# Patient Record
Sex: Male | Born: 1985 | Race: Black or African American | Hispanic: No | Marital: Married | Smoking: Never smoker
Health system: Southern US, Community
[De-identification: ages and names within clinical notes are randomized; demographics above are authoritative.]

## PROBLEM LIST (undated history)

## (undated) ENCOUNTER — Emergency Department: Admission: EM | Payer: Non-veteran care | Source: Home / Self Care

## (undated) DIAGNOSIS — Z789 Other specified health status: Secondary | ICD-10-CM

## (undated) HISTORY — PX: SHOULDER ARTHROSCOPY: SHX128

---
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
## 2015-05-28 ENCOUNTER — Emergency Department (HOSPITAL_COMMUNITY)
Admission: EM | Admit: 2015-05-28 | Discharge: 2015-05-28 | Disposition: A | Discharge status: Home / Self Care | Payer: Self-pay | Attending: Emergency Medicine | Admitting: Emergency Medicine

## 2015-05-28 ENCOUNTER — Emergency Department (HOSPITAL_COMMUNITY): Payer: Self-pay

## 2015-05-28 ENCOUNTER — Encounter (HOSPITAL_COMMUNITY): Payer: Self-pay | Admitting: *Deleted

## 2015-05-28 DIAGNOSIS — M25562 Pain in left knee: Secondary | ICD-10-CM | POA: Insufficient documentation

## 2015-05-28 DIAGNOSIS — G8929 Other chronic pain: Secondary | ICD-10-CM | POA: Insufficient documentation

## 2015-05-28 MED ORDER — KETOROLAC TROMETHAMINE 60 MG/2ML IM SOLN
60.0000 mg | Freq: Once | INTRAMUSCULAR | Status: AC
  Administered 2015-05-28: 60 mg via INTRAMUSCULAR
  Filled 2015-05-28: qty 2

## 2015-05-28 MED ORDER — DICLOFENAC SODIUM 1 % TD GEL: 4.0000 g | Freq: Four times a day (QID) | TRANSDERMAL | Status: DC

## 2015-05-28 MED ORDER — IBUPROFEN 800 MG PO TABS: 800.0000 mg | ORAL_TABLET | Freq: Three times a day (TID) | ORAL | Status: DC

## 2015-05-28 MED ORDER — OXYCODONE-ACETAMINOPHEN 5-325 MG PO TABS: 1.0000 | ORAL_TABLET | ORAL | Status: DC | PRN

## 2015-05-28 NOTE — ED Provider Notes (Signed)
CSN: [id number]     Arrival date & time 05/28/15  1023 History  By signing my name below, I, Linna Darner, attest that this documentation has been prepared under the direction and in the presence of non-physician practitioner, Harolyn Rutherford, PA-C. Electronically Signed: Linna Darner, Scribe. 05/28/2015. 10:33 AM.    Chief Complaint  Patient presents with  . Knee Pain    The history is provided by the patient. No language interpreter was used.     HPI Comments: Carlos Mcclure is a 30 y.o. male with no pertinent PMHx who presents to the Emergency Department complaining of gradual onset, constant, severe, worsening, stabbing, left knee pain and swelling for the last month. Pt reports that he first noticed his left knee pain around a month ago when he was moving furniture into his new apartment. He notes that he bruised his left knee while serving in [place] in 2012 and states that it would ache intermittently but did not affect ambulation or function. He states that his pain is 0/10 while at rest, but is 10/10 with standing, flexion, and ambulation. Pt has tried tylenol, creams, ice, knee brace, and elevation for his left knee pain with temporary relief. Pt is able to wiggle his left toes. He has not had a left knee x-ray since returning from [place]. Pt notes that he frequently climbs stairs and is on his feet often for work which is exacerbating his left knee pain. Pt has no h/o HIV, blood disorders, kidney disorders, IV drug use, or steroid use. He does not smoke or drink. Pt has no known allergies to medications. He further denies fever, chills, nausea, vomiting, neuro deficits, or any other associated symptoms.  History reviewed. No pertinent past medical history. History reviewed. No pertinent past surgical history. History reviewed. No pertinent family history. Social History  Substance Use Topics  . Smoking status: Never Smoker   . Smokeless tobacco: None  . Alcohol Use: No     Review of Systems  Constitutional: Negative for fever and chills.  Gastrointestinal: Negative for nausea and vomiting.  Musculoskeletal: Positive for arthralgias (left knee).  Skin: Negative for color change and pallor.  Neurological: Negative for numbness.    Allergies  Review of patient's allergies indicates no known allergies.  Home Medications   Prior to Admission medications   Medication Sig Start Date End Date Taking? Authorizing Provider  diclofenac sodium (VOLTAREN) 1 % GEL Apply 4 g topically 4 (four) times daily. 05/28/15   Shawn C Joy, PA-C  ibuprofen (ADVIL,MOTRIN) 800 MG tablet Take 1 tablet (800 mg total) by mouth 3 (three) times daily. 05/28/15   Shawn C Joy, PA-C  oxyCODONE-acetaminophen (PERCOCET/ROXICET) 5-325 MG tablet Take 1-2 tablets by mouth every 4 (four) hours as needed for severe pain. 05/28/15   Shawn C Joy, PA-C   BP 159/82 mmHg  Pulse 83  Temp(Src) 98.4 F (36.9 C) (Oral)  Resp 20  SpO2 100% Physical Exam  Constitutional: He appears well-developed and well-nourished. No distress.  HENT:  Head: Normocephalic and atraumatic.  Eyes: Conjunctivae are normal.  Cardiovascular: Normal rate, regular rhythm and intact distal pulses.   Pulmonary/Chest: Effort normal.  Musculoskeletal:  Full ROM in left knee. No discernible swelling. No erythema, overlying wounds, or increased warmth. No crepitus, deformity, or discernible effusion.  Neurological: He is alert. He has normal reflexes.  Skin: Skin is warm and dry. He is not diaphoretic.  Nursing note and vitals reviewed.   ED Course  Procedures (including critical  care time)  DIAGNOSTIC STUDIES: Oxygen Saturation is 100% on RA, normal by my interpretation.    COORDINATION OF CARE: 10:33 AM Discussed treatment plan with pt at bedside and pt agreed to plan.   Imaging Review Dg Knee Complete 4 Views Left  05/28/2015  CLINICAL DATA:  Left knee pain for 1 month after possible injury moving furniture.  Initial encounter. EXAM: LEFT KNEE - COMPLETE 4+ VIEW COMPARISON:  None. FINDINGS: There is no evidence of fracture, dislocation, or joint effusion. There is no evidence of arthropathy or other focal bone abnormality. Soft tissues are unremarkable. IMPRESSION: Normal left knee. Electronically Signed   By: Lupita RaiderJames  Green Jr, M.D.   On: 05/28/2015 11:23   I have personally reviewed and evaluated these images as part of my medical decision-making.   EKG Interpretation None      MDM   Final diagnoses:  Knee pain, chronic, left    Carlos Mcclure presents with chronic left knee pain that has not been evaluated previously.  This patient's pain is consistent with either a bursitis or inflammation due to overuse. No evidence or risk factors for septic joint. X-ray shows no abnormalities. Patient will need to follow-up with orthopedics. Referral given. Home care and return precautions discussed. Patient voices understanding of these instructions and is comfortable with discharge.  I personally performed the services described in this documentation, which was scribed in my presence. The recorded information has been reviewed and is accurate.    Anselm PancoastShawn C Joy, PA-C 05/28/15 1132  Loren Raceravid Yelverton, MD 06/01/15 1655

## 2015-05-28 NOTE — ED Notes (Signed)
Pt is in stable condition upon d/c and ambulates from ED. 

## 2015-05-28 NOTE — ED Notes (Signed)
Pt states he has been having left knee pain for several weeks. Pt states he noticed the pain while moving into his apt over a month ago. Pt endorses edema and has been wearing a knee brace. Pt states it hurts to put pressure on his leg and is having difficulty walking up stairs. Pt states he has been taking tylenol, muscle/arthritis creams, ice, and elevation with little relief and temporary results.

## 2015-05-28 NOTE — Discharge Instructions (Signed)
You have been seen today for knee pain. Your imaging showed no abnormalities. Follow up with orthopedics as soon as possible. Call the number provided to set up an appointment. In the meantime, use heat, elevation, rest, compression, and anti-inflammatory medications such as ibuprofen or naproxen. Diclofenac gel is an anti-inflammatory medication that can be applied directly to the knee. Follow up with PCP as needed. Return to ED should symptoms worsen.  RESOURCE GUIDE  Chronic Pain Problems: Contact [facility]  [phone] Patients need to be referred by their primary care doctor.  Insufficient Money for Medicine: Contact United Way:  call "211" or [facility] [phone].  No Primary Care Doctor: - Call [facility]  [phone] - can help you locate a primary care doctor that  accepts your insurance, provides certain services, etc. - Physician Referral Service- [phone]  Agencies that provide inexpensive medical care: - [facility]  [phone] - [facility]  [phone] - [facility]  [phone] - Women's Clinic  [phone] - Planned Parenthood  [phone] Haynes Bast[facility]  [phone]  Medicaid-accepting [facility] Providers: - [facility]- [address], [address]  [phone], Mon-Fri 9am-7pm, Sat 9am-1pm - [facility]- [address], [address] [place]  [phone] - [facility]- [address], [address] [place]  [phone] [facility]- [address]  [phone] - Renaye RakersVeita Bland- [address], [address], [phone]  Only accepts [place] patients after they have their name  applied to their card  Self Pay (no insurance) in [place]: - Sickle Cell Patients: Dr Willey BladeEric Dean, [facility]  [address], [phone] - [facility]- [address]  [phone]       [facility]ge Gainer-       [facility]- [address], [address]       -     [facility]- see information above (Speak to Cit[employer]Pam H if you do not have insurance)       -  [facility]- [address], [phone]       -  [facility]- [address],  [phone]       -  [facility]- [address], [phone]       -  Dr Julio Sickssei-Bonsu-  [address], [address], [place], [phone]       -  [facility]- [address], [phone]       -  [facility]- [address], [phone], also [address], [phone]       -    [facility]- [address] [place], [phone], 1st & 3rd Saturday   every month, 10am-1pm  1) Find a Doctor and Pay Out of Pocket Although you won't have to find out who is covered by your insurance plan, it is a good idea to ask around and get recommendations. You will then need to call the office and see if the doctor you have chosen will accept you as a new patient and what types of options they offer for patients who are self-pay. Some doctors offer discounts or will set up payment plans for their patients who do not have insurance, but you will need to ask so you aren't surprised when you get to  your appointment.  2) Contact Your Local Health Department Not all health departments have doctors that can see patients for sick visits, but many do, so it is worth a call to see if yours does. If you don't know where your local health department is, you can check in your phone book. The CDC also has a tool to help you locate your state's health department, and many state websites also have listings of all of their local health departments.  3) Find a Walk-in Clinic If your illness is not likely to be very severe or complicated, you may want to try a walk in clinic. These are popping up all over the country in pharmacies, drugstores, and shopping centers. They're usually staffed by nurse practitioners or  physician assistants that have been trained to treat common illnesses and complaints. They're usually fairly quick and inexpensive. However, if you have serious medical issues or chronic medical problems, these are probably not your best option  STD Testing - [facility] Department of [facility] [place], STD Clinic, [address] [place], phone [phone] or [phone].  Monday - Friday, call for an appointment. [facility] Department of [employer], STD Clinic, [place] [address], [place], phone [phone] or [phone].  Monday - Friday, call for an appointment.  Abuse/Neglect: [facility] Child Abuse Hotline 628-112-7616 [facility] Child Abuse Hotline 918-154-9106 (After Hours)  Emergency Shelter:  [facility] [phone]  Maternity Homes: - Room at the [place] [facility] [phone] - [facility] [phone]  MRSA Hotline #:   (858)634-1360  [facility] Resources  [facility]  [employer] [facility] Dept. [address]                 [address]         [address]  Blondell Reveal Phone:  [phone]                                  Phone:  [phone]                   Phone:  [phone]  [facility], [phone] - [phone]       -     [facility] in [place], [address]                                  [phone], [facility] Child Abuse Hotline (437)606-4132 or 339-661-8875 (After Hours)   Behavioral Health Services  Substance Abuse Resources: - Alcohol and Drug Services  [phone] - [facility] [phone] - [facility] [place] [phone] Floydene Flock [phone] - Residential & Outpatient  Substance Abuse Program  [phone]  Psychological Services: - [facility]  [place], [place] [address], [place], [place]: [phone] or [phone], PicCapture.uy  Dental Assistance  If unable to pay or uninsured, contact:  [facility] or [facility]. to become qualified for the adult dental clinic.  Patients with Medicaid: [facility] [phone] [address], [place] [address] [phone]  If unable to pay, or uninsured, contact [facility] [phone]) or [place] [phone] in [place], [place] in [facility]) to become qualified for the adult dental clinic   Other Valley- [place], [place], [place], [address], [place], [place], 2nd and 4th Thursday of the month at 6:30am.  10 clients each day by appointment, can sometimes see walk-in patients if someone does not show for an appointment. [facility]- [address] [place]ard D[place]er [place], [place], [address], [place], [place], [place], [address], [place] Department- [place] Department- [place] Department- [phone]

## 2016-03-05 ENCOUNTER — Emergency Department (HOSPITAL_COMMUNITY): Payer: Non-veteran care

## 2016-03-05 ENCOUNTER — Emergency Department (HOSPITAL_COMMUNITY)
Admission: EM | Admit: 2016-03-05 | Discharge: 2016-03-06 | Disposition: A | Discharge status: Home / Self Care | Payer: Non-veteran care | Attending: Emergency Medicine | Admitting: Emergency Medicine

## 2016-03-05 ENCOUNTER — Encounter (HOSPITAL_COMMUNITY): Payer: Self-pay | Admitting: Emergency Medicine

## 2016-03-05 DIAGNOSIS — R0789 Other chest pain: Secondary | ICD-10-CM | POA: Insufficient documentation

## 2016-03-05 DIAGNOSIS — R079 Chest pain, unspecified: Secondary | ICD-10-CM | POA: Diagnosis present

## 2016-03-05 LAB — BASIC METABOLIC PANEL
ANION GAP: 9 (ref 5–15)
BUN: 15 mg/dL (ref 6–20)
CALCIUM: 9.2 mg/dL (ref 8.9–10.3)
CO2: 24 mmol/L (ref 22–32)
Chloride: 104 mmol/L (ref 101–111)
Creatinine, Ser: 1.46 mg/dL — ABNORMAL HIGH (ref 0.61–1.24)
Glucose, Bld: 95 mg/dL (ref 65–99)
Potassium: 3.9 mmol/L (ref 3.5–5.1)
SODIUM: 137 mmol/L (ref 135–145)

## 2016-03-05 LAB — CBC
HCT: 41.1 % (ref 39.0–52.0)
HEMOGLOBIN: 13.9 g/dL (ref 13.0–17.0)
MCH: 28.3 pg (ref 26.0–34.0)
MCHC: 33.8 g/dL (ref 30.0–36.0)
MCV: 83.7 fL (ref 78.0–100.0)
PLATELETS: 322 10*3/uL (ref 150–400)
RBC: 4.91 MIL/uL (ref 4.22–5.81)
RDW: 13.2 % (ref 11.5–15.5)
WBC: 9.4 10*3/uL (ref 4.0–10.5)

## 2016-03-05 LAB — I-STAT TROPONIN, ED: TROPONIN I, POC: 0 ng/mL (ref 0.00–0.08)

## 2016-03-05 MED ORDER — SODIUM CHLORIDE 0.9 % IV BOLUS (SEPSIS)
1000.0000 mL | Freq: Once | INTRAVENOUS | Status: AC
  Administered 2016-03-06: 1000 mL via INTRAVENOUS

## 2016-03-05 NOTE — ED Notes (Signed)
Patient returned from xray.

## 2016-03-05 NOTE — ED Provider Notes (Signed)
[facility] Provider Note   CSN: [id number] Arrival date & time: 03/05/16  2226     History   Chief Complaint Chief Complaint  Patient presents with  . Chest Pain    HPI BRYEN HINDERMAN is a 31 y.o. male.  HPI   Carlos Mcclure is a 31 y.o. male, patient with no pertinent past medical history, presenting to the ED with chest pain that began suddenly around 7 pm this evening. Pain was retrosternal, waxing and waning, sharp, nonradiating. Lasted for about 2 hours. Seemingly resolved spontaneously. Has not had this pain before. Endorses accompanying shortness of breath that felt like a tightness in the chest. Denies current symptoms. Laying down did not change the discomfort. Received 324 mg ASA with EMS. Endorses poor fluid intake today.  Denies alcohol or illicit drug use. Endorses using C4, a preworkout supplement for the past 3 months. Denies N/V, cough, fever/chills, diaphoresis, dizziness, or any other complaints.   [facility] [place]  History reviewed. No pertinent past medical history.  There are no active problems to display for this patient.   No past surgical history on file.     Home Medications    Prior to Admission medications   Not on File    Family History Family History  Problem Relation Age of Onset  . Heart attack Father 2    Social History Social History  Substance Use Topics  . Smoking status: Never Smoker  . Smokeless tobacco: Not on file  . Alcohol use No     Allergies   Patient has no known allergies.   Review of Systems Review of Systems  Constitutional: Negative for chills, diaphoresis and fever.  Respiratory: Positive for shortness of breath (resolved). Negative for cough.   Cardiovascular: Positive for chest pain (resolved).  Gastrointestinal: Negative for nausea and vomiting.  Neurological: Negative for dizziness and light-headedness.  All other systems reviewed and are negative.    Physical Exam Updated  Vital Signs BP 139/88   Pulse 90   Temp 98.9 F (37.2 C) (Oral)   Resp 18   SpO2 97%   Physical Exam  Constitutional: He appears well-developed and well-nourished. No distress.  HENT:  Head: Normocephalic and atraumatic.  Eyes: Conjunctivae are normal.  Neck: Neck supple.  Cardiovascular: Normal rate, regular rhythm, normal heart sounds and intact distal pulses.   Pulmonary/Chest: Effort normal and breath sounds normal. No respiratory distress. He exhibits tenderness (left side of sternum).  Abdominal: Soft. There is no tenderness. There is no guarding.  Musculoskeletal: He exhibits no edema.  Lymphadenopathy:    He has no cervical adenopathy.  Neurological: He is alert.  Skin: Skin is warm and dry. He is not diaphoretic.  Psychiatric: He has a normal mood and affect. His behavior is normal.  Nursing note and vitals reviewed.    ED Treatments / Results  Labs (all labs ordered are listed, but only abnormal results are displayed) Labs Reviewed  BASIC METABOLIC PANEL - Abnormal; Notable for the following:       Result Value   Creatinine, Ser 1.46 (*)    All other components within normal limits  CBC  I-STAT TROPOININ, ED  Rosezena Sensor, ED     EKG  EKG Interpretation None       Radiology Dg Chest 2 View  Result Date: 03/05/2016 CLINICAL DATA:  31 year old male with chest tightness. EXAM: CHEST  2 VIEW COMPARISON:  None. FINDINGS: The heart size and mediastinal contours are within  normal limits. Both lungs are clear. The visualized skeletal structures are unremarkable. IMPRESSION: No active cardiopulmonary disease. Electronically Signed   By: Elgie CollardArash  Radparvar M.D.   On: 03/05/2016 23:46    Procedures Procedures (including critical care time)  Medications Ordered in ED Medications  sodium chloride 0.9 % bolus 1,000 mL (0 mLs Intravenous Stopped 03/06/16 0046)     Initial Impression / Assessment and Plan / ED Course  I have reviewed the triage vital signs  and the nursing notes.  Pertinent labs & imaging results that were available during my care of the patient were reviewed by me and considered in my medical decision making (see chart for details).     Patient presents for an episode of chest pain that has since resolved. Low suspicion for ACS. HEART score is 0, indicating low risk for a cardiac event. Wells criteria score is 0, indicating low risk for PE. No recurrence of patient's pain. Delta troponins negative. Mildly elevated creatinine I suspect is due to either dehydration or creatine use. Upon reevaluation, patient states that he has still not had any recurrence of his pain or discomfort. Patient ambulated without difficulty or recurrence of pain. Patient follow up with his PCP. Return precautions discussed. Patient voiced understanding of all instructions and is comfortable with discharge.   Vitals:   03/05/16 2226 03/05/16 2228 03/05/16 2230 03/05/16 2245  BP:   141/77 139/88  Pulse:  78 77 90  Resp:  14 19 18   Temp:  98.9 F (37.2 C)    TempSrc:  Oral    SpO2: 96% 96% 94% 97%   Vitals:   03/06/16 0030 03/06/16 0045 03/06/16 0100 03/06/16 0115  BP: 138/78 114/94 143/81 134/76  Pulse: (!) 58 64 76 64  Resp:    18  Temp:      TempSrc:      SpO2: 98% 95% 94% 95%     Final Clinical Impressions(s) / ED Diagnoses   Final diagnoses:  Atypical chest pain    New Prescriptions New Prescriptions   No medications on file     Anselm PancoastShawn C Rehan Holness, PA-C 03/06/16 0218    Anselm PancoastShawn C Christoher Drudge, PA-C 03/06/16 [phone]    Shon Batonourtney F Horton, MD 03/06/16 2316

## 2016-03-05 NOTE — ED Triage Notes (Signed)
Pt arrives via EMS c/o Left sided chest pain that started at 1900, sharp in nature. Pt was given 324 ASA. Pain free in triage

## 2016-03-06 LAB — I-STAT TROPONIN, ED: TROPONIN I, POC: 0.01 ng/mL (ref 0.00–0.08)

## 2016-03-06 MED ORDER — SODIUM CHLORIDE 0.9 % IV BOLUS (SEPSIS): 1000.0000 mL | Freq: Once | INTRAVENOUS | Status: DC

## 2016-03-06 NOTE — ED Notes (Signed)
Pt understood dc material. NAD noted. 

## 2016-03-06 NOTE — Discharge Instructions (Addendum)
There were no abnormalities noted on your labs and chest x-ray, other than some possible dehydration. The cardiac enzyme tests were negative for indications of heart attack. [facility]. Return to the ED for recurrence of symptoms.

## 2018-08-17 ENCOUNTER — Other Ambulatory Visit: Payer: Self-pay

## 2018-08-17 ENCOUNTER — Encounter (HOSPITAL_COMMUNITY): Payer: Self-pay

## 2018-08-17 ENCOUNTER — Emergency Department (HOSPITAL_COMMUNITY): Payer: No Typology Code available for payment source

## 2018-08-17 ENCOUNTER — Emergency Department (HOSPITAL_COMMUNITY)
Admission: EM | Admit: 2018-08-17 | Discharge: 2018-08-17 | Disposition: A | Discharge status: Home / Self Care | Payer: No Typology Code available for payment source | Attending: Emergency Medicine | Admitting: Emergency Medicine

## 2018-08-17 DIAGNOSIS — R1084 Generalized abdominal pain: Secondary | ICD-10-CM | POA: Insufficient documentation

## 2018-08-17 DIAGNOSIS — J189 Pneumonia, unspecified organism: Secondary | ICD-10-CM

## 2018-08-17 DIAGNOSIS — U071 COVID-19: Secondary | ICD-10-CM | POA: Insufficient documentation

## 2018-08-17 DIAGNOSIS — M791 Myalgia, unspecified site: Secondary | ICD-10-CM | POA: Diagnosis present

## 2018-08-17 HISTORY — DX: Other specified health status: Z78.9

## 2018-08-17 LAB — SARS CORONAVIRUS 2 BY RT PCR (HOSPITAL ORDER, PERFORMED IN ~~LOC~~ HOSPITAL LAB): SARS Coronavirus 2: POSITIVE — AB

## 2018-08-17 LAB — CBC WITH DIFFERENTIAL/PLATELET
Abs Immature Granulocytes: 0.05 10*3/uL (ref 0.00–0.07)
Basophils Absolute: 0 10*3/uL (ref 0.0–0.1)
Basophils Relative: 0 %
Eosinophils Absolute: 0.1 10*3/uL (ref 0.0–0.5)
Eosinophils Relative: 1 %
HCT: 42.7 % (ref 39.0–52.0)
Hemoglobin: 14 g/dL (ref 13.0–17.0)
Immature Granulocytes: 1 %
Lymphocytes Relative: 23 %
Lymphs Abs: 1.8 10*3/uL (ref 0.7–4.0)
MCH: 27.9 pg (ref 26.0–34.0)
MCHC: 32.8 g/dL (ref 30.0–36.0)
MCV: 85.2 fL (ref 80.0–100.0)
Monocytes Absolute: 0.7 10*3/uL (ref 0.1–1.0)
Monocytes Relative: 9 %
Neutro Abs: 5.2 10*3/uL (ref 1.7–7.7)
Neutrophils Relative %: 66 %
Platelets: 286 10*3/uL (ref 150–400)
RBC: 5.01 MIL/uL (ref 4.22–5.81)
RDW: 13.2 % (ref 11.5–15.5)
WBC: 7.9 10*3/uL (ref 4.0–10.5)
nRBC: 0 % (ref 0.0–0.2)

## 2018-08-17 LAB — URINALYSIS, ROUTINE W REFLEX MICROSCOPIC
Bilirubin Urine: NEGATIVE
Glucose, UA: NEGATIVE mg/dL
Hgb urine dipstick: NEGATIVE
Ketones, ur: 5 mg/dL — AB
Leukocytes,Ua: NEGATIVE
Nitrite: NEGATIVE
Protein, ur: 30 mg/dL — AB
Specific Gravity, Urine: 1.027 (ref 1.005–1.030)
pH: 5 (ref 5.0–8.0)

## 2018-08-17 LAB — COMPREHENSIVE METABOLIC PANEL
ALT: 28 U/L (ref 0–44)
AST: 27 U/L (ref 15–41)
Albumin: 3.8 g/dL (ref 3.5–5.0)
Alkaline Phosphatase: 91 U/L (ref 38–126)
Anion gap: 10 (ref 5–15)
BUN: 9 mg/dL (ref 6–20)
CO2: 24 mmol/L (ref 22–32)
Calcium: 8.9 mg/dL (ref 8.9–10.3)
Chloride: 103 mmol/L (ref 98–111)
Creatinine, Ser: 1.31 mg/dL — ABNORMAL HIGH (ref 0.61–1.24)
GFR calc Af Amer: >60 mL/min (ref >60)
GFR calc non Af Amer: >60 mL/min (ref >60)
Glucose, Bld: 100 mg/dL — ABNORMAL HIGH (ref 70–99)
Potassium: 4.1 mmol/L (ref 3.5–5.1)
Sodium: 137 mmol/L (ref 135–145)
Total Bilirubin: 0.8 mg/dL (ref 0.3–1.2)
Total Protein: 7.2 g/dL (ref 6.5–8.1)

## 2018-08-17 LAB — LIPASE, BLOOD: Lipase: 52 U/L — ABNORMAL HIGH (ref 11–51)

## 2018-08-17 MED ORDER — SODIUM CHLORIDE 0.9 % IV BOLUS
1000.0000 mL | Freq: Once | INTRAVENOUS | Status: AC
  Administered 2018-08-17: 1000 mL via INTRAVENOUS

## 2018-08-17 MED ORDER — DOXYCYCLINE HYCLATE 100 MG PO CAPS: 100.0000 mg | ORAL_CAPSULE | Freq: Two times a day (BID) | ORAL | 0 refills | Status: AC

## 2018-08-17 MED ORDER — DOXYCYCLINE HYCLATE 100 MG PO TABS
100.0000 mg | ORAL_TABLET | Freq: Once | ORAL | Status: AC
  Administered 2018-08-17: 100 mg via ORAL
  Filled 2018-08-17: qty 1

## 2018-08-17 MED ORDER — ONDANSETRON HCL 4 MG/2ML IJ SOLN
4.0000 mg | Freq: Once | INTRAMUSCULAR | Status: AC
  Administered 2018-08-17: 4 mg via INTRAVENOUS
  Filled 2018-08-17: qty 2

## 2018-08-17 MED ORDER — IOHEXOL 300 MG/ML  SOLN
100.0000 mL | Freq: Once | INTRAMUSCULAR | Status: AC | PRN
  Administered 2018-08-17: 07:00:00 100 mL via INTRAVENOUS

## 2018-08-17 NOTE — Discharge Instructions (Signed)
You can take Tylenol or Ibuprofen as directed for pain. You can alternate Tylenol and Ibuprofen every 4 hours. If you take Tylenol at 1pm, then you can take Ibuprofen at 5pm. Then you can take Tylenol again at 9pm.   Make sure you are staying hydrated and drinking plenty of fluids.  As we discussed, you have developed pneumonia.  Please take antibiotics as directed.  Make sure you are getting plenty of rest.  As we discussed, you should quarantine and self isolate for 2 weeks for your symptoms.  Monitor your symptoms closely.  Return the emergency department if you have any worsening trouble breathing, chest pain or any other worsening or concerning symptoms.     Person Under Monitoring Name: Carlos Mcclure  Location: [address] [place] [address]   Infection Prevention Recommendations for Individuals Confirmed to have, or Being Evaluated for, 2019 Novel Coronavirus (COVID-19) Infection Who Receive Care at Home  Individuals who are confirmed to have, or are being evaluated for, COVID-19 should follow the prevention steps below until a healthcare provider or local or state health department says they can return to normal activities.  Stay home except to get medical care You should restrict activities outside your home, except for getting medical care. Do not go to work, school, or public areas, and do not use public transportation or taxis.  Call ahead before visiting your doctor Before your medical appointment, call the healthcare provider and tell them that you have, or are being evaluated for, COVID-19 infection. This will help the healthcare providers office take steps to keep other people from getting infected. Ask your healthcare provider to call the local or state health department.  Monitor your symptoms Seek prompt medical attention if your illness is worsening (e.g., difficulty breathing). Before going to your medical appointment, call the healthcare  provider and tell them that you have, or are being evaluated for, COVID-19 infection. Ask your healthcare provider to call the local or state health department.  Wear a facemask You should wear a facemask that covers your nose and mouth when you are in the same room with other people and when you visit a healthcare provider. People who live with or visit you should also wear a facemask while they are in the same room with you.  Separate yourself from other people in your home As much as possible, you should stay in a different room from other people in your home. Also, you should use a separate bathroom, if available.  Avoid sharing household items You should not share dishes, drinking glasses, cups, eating utensils, towels, bedding, or other items with other people in your home. After using these items, you should wash them thoroughly with soap and water.  Cover your coughs and sneezes Cover your mouth and nose with a tissue when you cough or sneeze, or you can cough or sneeze into your sleeve. Throw used tissues in a lined trash can, and immediately wash your hands with soap and water for at least 20 seconds or use an alcohol-based hand rub.  Wash your [facility] your hands often and thoroughly with soap and water for at least 20 seconds. You can use an alcohol-based hand sanitizer if soap and water are not available and if your hands are not visibly dirty. Avoid touching your eyes, nose, and mouth with unwashed hands.   Prevention Steps for Caregivers and Household Members of Individuals Confirmed to have, or Being Evaluated for, COVID-19 Infection Being Cared  for in the Home  If you live with, or provide care at home for, a person confirmed to have, or being evaluated for, COVID-19 infection please follow these guidelines to prevent infection:  Follow healthcare providers instructions Make sure that you understand and can help the patient follow any healthcare provider  instructions for all care.  Provide for the patients basic needs You should help the patient with basic needs in the home and provide support for getting groceries, prescriptions, and other personal needs.  Monitor the patients symptoms If they are getting sicker, call his or her medical provider and tell them that the patient has, or is being evaluated for, COVID-19 infection. This will help the healthcare providers office take steps to keep other people from getting infected. Ask the healthcare provider to call the local or state health department.  Limit the number of people who have contact with the patient  If possible, have only one caregiver for the patient.  Other household members should stay in another home or place of residence. If this is not possible, they should stay  in another room, or be separated from the patient as much as possible. Use a separate bathroom, if available.  Restrict visitors who do not have an essential need to be in the home.  Keep older adults, very young children, and other sick people away from the patient Keep older adults, very young children, and those who have compromised immune systems or chronic health conditions away from the patient. This includes people with chronic heart, lung, or kidney conditions, diabetes, and cancer.  Ensure good ventilation Make sure that shared spaces in the home have good air flow, such as from an air conditioner or an opened window, weather permitting.  Wash your hands often  Wash your hands often and thoroughly with soap and water for at least 20 seconds. You can use an alcohol based hand sanitizer if soap and water are not available and if your hands are not visibly dirty.  Avoid touching your eyes, nose, and mouth with unwashed hands.  Use disposable paper towels to dry your hands. If not available, use dedicated cloth towels and replace them when they become wet.  Wear a facemask and gloves  Wear a  disposable facemask at all times in the room and gloves when you touch or have contact with the patients blood, body fluids, and/or secretions or excretions, such as sweat, saliva, sputum, nasal mucus, vomit, urine, or feces.  Ensure the mask fits over your nose and mouth tightly, and do not touch it during use.  Throw out disposable facemasks and gloves after using them. Do not reuse.  Wash your hands immediately after removing your facemask and gloves.  If your personal clothing becomes contaminated, carefully remove clothing and launder. Wash your hands after handling contaminated clothing.  Place all used disposable facemasks, gloves, and other waste in a lined container before disposing them with other household waste.  Remove gloves and wash your hands immediately after handling these items.  Do not share dishes, glasses, or other household items with the patient  Avoid sharing household items. You should not share dishes, drinking glasses, cups, eating utensils, towels, bedding, or other items with a patient who is confirmed to have, or being evaluated for, COVID-19 infection.  After the person uses these items, you should wash them thoroughly with soap and water.  Wash laundry thoroughly  Immediately remove and wash clothes or bedding that have blood, body fluids, and/or secretions  or excretions, such as sweat, saliva, sputum, nasal mucus, vomit, urine, or feces, on them.  Wear gloves when handling laundry from the patient.  Read and follow directions on labels of laundry or clothing items and detergent. In general, wash and dry with the warmest temperatures recommended on the label.  Clean all areas the individual has used often  Clean all touchable surfaces, such as counters, tabletops, doorknobs, bathroom fixtures, toilets, phones, keyboards, tablets, and bedside tables, every day. Also, clean any surfaces that may have blood, body fluids, and/or secretions or excretions on  them.  Wear gloves when cleaning surfaces the patient has come in contact with.  Use a diluted bleach solution (e.g., dilute bleach with 1 part bleach and 10 parts water) or a household disinfectant with a label that says EPA-registered for coronaviruses. To make a bleach solution at home, add 1 tablespoon of bleach to 1 quart (4 cups) of water. For a larger supply, add  cup of bleach to 1 gallon (16 cups) of water.  Read labels of cleaning products and follow recommendations provided on product labels. Labels contain instructions for safe and effective use of the cleaning product including precautions you should take when applying the product, such as wearing gloves or eye protection and making sure you have good ventilation during use of the product.  Remove gloves and wash hands immediately after cleaning.  Monitor yourself for signs and symptoms of illness Caregivers and household members are considered close contacts, should monitor their health, and will be asked to limit movement outside of the home to the extent possible. Follow the monitoring steps for close contacts listed on the symptom monitoring form.   ? If you have additional questions, contact your local health department or call the epidemiologist on call at [phone] (available 24/7). ? This guidance is subject to change. For the most up-to-date guidance from [facility], please refer to their website: TripMetro.huhttps://www.cdc.gov/coronavirus/2019-ncov/hcp/guidance-prevent-spread.html

## 2018-08-17 NOTE — ED Provider Notes (Signed)
[facility] Provider Note   CSN: [id number] Arrival date & time: 08/17/18  0305    History   Chief Complaint Chief Complaint  Patient presents with   Generalized Body Aches    HPI Carlos Mcclure is a 33 y.o. male who presents for evaluation of generalized body aches, fatigue that is been ongoing for last 3 days.  He reports some associated subjective chills, nausea.  He states he has had a decrease in appetite.  He states he has not had any vomiting but has not felt like eating.  He states he has not measured a temperature but has had some chills.  He states that he has had some intermittent abdominal pain.  He states it is mostly in the right lower quadrant of the abdomen.  He states that he has had a cough and states it is productive of phlegm.  He has noticed some brown discharge with a cough but no hemoptysis.  He states he has some chest soreness associated with any cough but otherwise denies any chest pain.  He denies any trouble breathing.  He does not smoke.  He denies any history of COPD or asthma.  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  He is not any travel.     The history is provided by the patient.    Past Medical History:  Diagnosis Date   Known health problems: none     There are no active problems to display for this patient.   Past Surgical History:  Procedure Laterality Date   SHOULDER ARTHROSCOPY     bilateral shoulder        Home Medications    Prior to Admission medications   Medication Sig Start Date End Date Taking? Authorizing Provider  doxycycline (VIBRAMYCIN) 100 MG capsule Take 1 capsule (100 mg total) by mouth 2 (two) times daily for 7 days. 08/17/18 08/24/18  Maxwell CaulLayden, Alfred Eckley A, PA-C    Family History Family History  Problem Relation Age of Onset   Heart attack Father 5947    Social  History Social History   Tobacco Use   Smoking status: Never Smoker   Smokeless tobacco: Never Used  Substance Use Topics   Alcohol use: No   Drug use: No     Allergies   Patient has no known allergies.   Review of Systems Review of Systems  Constitutional: Positive for appetite change and fatigue. Negative for fever.  Respiratory: Positive for cough. Negative for shortness of breath.   Cardiovascular: Negative for chest pain.  Gastrointestinal: Positive for abdominal pain and nausea. Negative for vomiting.  Genitourinary: Negative for dysuria and hematuria.  Musculoskeletal: Positive for myalgias.  Neurological: Negative for headaches.  All other systems reviewed and are negative.    Physical Exam Updated Vital Signs BP 134/90 (BP Location: Left Arm)    Pulse 81    Temp 98.4 F (36.9 C) (Oral)    Resp 18    Ht 5\' 11"  (1.803 m)    Wt 108.9 kg    SpO2 99%    BMI 33.47 kg/m   Physical Exam Vitals signs and nursing note reviewed.  Constitutional:      Appearance: Normal appearance. He is well-developed.  HENT:     Head: Normocephalic and atraumatic.  Eyes:     General: Lids are normal.  Conjunctiva/sclera: Conjunctivae normal.     Pupils: Pupils are equal, round, and reactive to light.  Neck:     Musculoskeletal: Full passive range of motion without pain.  Cardiovascular:     Rate and Rhythm: Normal rate and regular rhythm.     Pulses: Normal pulses.     Heart sounds: Normal heart sounds. No murmur. No friction rub. No gallop.   Pulmonary:     Effort: Pulmonary effort is normal.     Breath sounds: Rales present.     Comments: Mild rales noted bilateral bases.  No wheezing.  No evidence of respiratory distress.  Able to speak in full sentences without any difficulty. Abdominal:     Palpations: Abdomen is soft. Abdomen is not rigid.     Tenderness: There is abdominal tenderness in the right lower quadrant. There is no right CVA tenderness, left CVA  tenderness or guarding.     Comments: Abdomen soft, nondistended.  Tenderness noted diffusely to the right lower quadrant.  No rigidity, guarding.  Negative Rovsing's, negative rebounding.  No CVA tenderness noted bilaterally.  Musculoskeletal: Normal range of motion.  Skin:    General: Skin is warm and dry.     Capillary Refill: Capillary refill takes less than 2 seconds.  Neurological:     Mental Status: He is alert and oriented to person, place, and time.  Psychiatric:        Speech: Speech normal.      ED Treatments / Results  Labs (all labs ordered are listed, but only abnormal results are displayed) Labs Reviewed  SARS CORONAVIRUS 2 (HOSPITAL ORDER, PERFORMED IN [facility]) - Abnormal; Notable for the following components:      Result Value   SARS Coronavirus 2 POSITIVE (*)    All other components within normal limits  COMPREHENSIVE METABOLIC PANEL - Abnormal; Notable for the following components:   Glucose, Bld 100 (*)    Creatinine, Ser 1.31 (*)    All other components within normal limits  LIPASE, BLOOD - Abnormal; Notable for the following components:   Lipase 52 (*)    All other components within normal limits  URINALYSIS, ROUTINE W REFLEX MICROSCOPIC - Abnormal; Notable for the following components:   Ketones, ur 5 (*)    Protein, ur 30 (*)    Bacteria, UA RARE (*)    All other components within normal limits  CBC WITH DIFFERENTIAL/PLATELET    EKG EKG Interpretation  Date/Time:  Saturday August 17 2018 03:13:08 EDT Ventricular Rate:  96 PR Interval:  164 QRS Duration: 86 QT Interval:  372 QTC Calculation: 469 R Axis:   75 Text Interpretation:  Normal sinus rhythm Anteroseptal infarct , age undetermined Left ventricular hypertrophy with strain pattern No old tracing to compare Reconfirmed by Dione BoozeGlick, David ([id number]) on 08/17/2018 3:35:43 AM   Radiology Ct Abdomen Pelvis W Contrast  Result Date: 08/17/2018 CLINICAL DATA:  Diffuse abdominal pain with  weakness and body aches for the past 3 days. COVID-19 positive. EXAM: CT ABDOMEN AND PELVIS WITH CONTRAST TECHNIQUE: Multidetector CT imaging of the abdomen and pelvis was performed using the standard protocol following bolus administration of intravenous contrast. CONTRAST:  100mL OMNIPAQUE IOHEXOL 300 MG/ML  SOLN COMPARISON:  None. FINDINGS: Lower chest: Patchy peripheral opacities at both lung bases. Hepatobiliary: No focal liver abnormality is seen. No gallstones, gallbladder wall thickening, or biliary dilatation. Pancreas: Unremarkable. No pancreatic ductal dilatation or surrounding inflammatory changes. Spleen: Normal in size without focal abnormality. Adrenals/Urinary Tract:  Adrenal glands are unremarkable. Kidneys are normal, without renal calculi, focal lesion, or hydronephrosis. 1.3 cm simple cyst in the lower pole of the left kidney. Bladder is largely decompressed. Stomach/Bowel: Stomach is within normal limits. Appendix appears normal. No evidence of bowel wall thickening, distention, or inflammatory changes. Vascular/Lymphatic: No significant vascular findings are present. No enlarged abdominal or pelvic lymph nodes. Reproductive: Prostate is unremarkable. Other: No abdominal wall hernia or abnormality. No abdominopelvic ascites. No pneumoperitoneum. Musculoskeletal: No acute or significant osseous findings. IMPRESSION: 1.  No acute intra-abdominal process. 2. Patchy peripheral opacities at both lung bases, consistent with COVID-19 pneumonia. Electronically Signed   By: Obie DredgeWilliam T Derry M.D.   On: 08/17/2018 08:01   Dg Chest Portable 1 View  Result Date: 08/17/2018 CLINICAL DATA:  33 y/o M; body aches, cough, and fatigue for 3 days. EXAM: PORTABLE CHEST 1 VIEW COMPARISON:  None. FINDINGS: Stable cardiac silhouette given projection and technique. Increased basilar interstitial opacities. No pleural effusion or pneumothorax. Bones are. IMPRESSION: Increased basilar interstitial opacities, possibly  bronchitic changes or atypical/viral pneumonia. Electronically Signed   By: Mitzi HansenLance  Furusawa-Stratton M.D.   On: 08/17/2018 05:33    Procedures Procedures (including critical care time)  Medications Ordered in ED Medications  doxycycline (VIBRA-TABS) tablet 100 mg (has no administration in time range)  sodium chloride 0.9 % bolus 1,000 mL (0 mLs Intravenous Stopped 08/17/18 0643)  ondansetron (ZOFRAN) injection 4 mg (4 mg Intravenous Given 08/17/18 0519)  iohexol (OMNIPAQUE) 300 MG/ML solution 100 mL (100 mLs Intravenous Contrast Given 08/17/18 0655)     Initial Impression / Assessment and Plan / ED Course  I have reviewed the triage vital signs and the nursing notes.  Pertinent labs & imaging results that were available during my care of the patient were reviewed by me and considered in my medical decision making (see chart for details).        33 year old male with no significant past medical history who presents for evaluation of several days of generalized weakness, myalgias, fatigue, nausea, abdominal pain, decreased appetite.  Does report exposure to COVID as he has been around several nurses who have tested positive.  Patient is afebrile, non-toxic appearing, sitting comfortably on examination table. Vital signs reviewed and stable.  On exam, lungs clear to auscultation without any difficulty.  He does have tenderness noted to the right lower quadrant.  No rigidity, guarding.  Concern for COVID versus infectious abdominal process.  Plan to check labs, chest x-ray.  Given right lower quadrant tenderness, plan for CT.  UA negative for any infectious etiology.  CBC without any significant leukocytosis or anemia.  CMP shows creatinine of 1.31.  Otherwise unremarkable.  Lipase is slightly elevated at 52.  Chest x-ray does show bilateral opacities that could reflect viral/atypical pneumonia.  CT scan shows no evidence of acute intra-or abdominal abnormality.  No evidence of  appendicitis.  Discussed results with patient.  He reports improvement in abdominal pain.  Repeat abdominal exam is improved.  Patient with no respiratory distress.  Vitals stable.  He is not hypoxic or tachycardic.  Patient is appropriate for outpatient management of COVID-19 and pneumonia.  We will plan to start patient on doxycycline. Discussed patient with Dr. Preston FleetingGlick who is agreeable to plan.  Encouraged at home supportive care measures. At this time, patient exhibits no emergent life-threatening condition that require further evaluation in ED or admission. Patient had ample opportunity for questions and discussion. All patient's questions were answered with full understanding. Strict return  precautions discussed. Patient expresses understanding and agreement to plan.   Carlos Mcclure was evaluated in Emergency Department on 08/17/2018 for the symptoms described in the history of present illness. He was evaluated in the context of the global COVID-19 pandemic, which necessitated consideration that the patient might be at risk for infection with the SARS-CoV-2 virus that causes COVID-19. Institutional protocols and algorithms that pertain to the evaluation of patients at risk for COVID-19 are in a state of rapid change based on information released by regulatory bodies including the CDC and federal and state organizations. These policies and algorithms were followed during the patient's care in the ED.   Portions of this note were generated with [employer]. Dictation errors may occur despite best attempts at proofreading.   Final Clinical Impressions(s) / ED Diagnoses   Final diagnoses:  [id number]  Community acquired pneumonia, unspecified laterality    ED Discharge Orders         Ordered    doxycycline (VIBRAMYCIN) 100 MG capsule  2 times daily     08/17/18 0809           Volanda Napoleon, PA-C 28/76/81 [id number]    Delora Fuel, MD 62/03/55 [id number]

## 2018-08-17 NOTE — ED Triage Notes (Signed)
Pt c/o body aches and fatigue x3 days. 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

## 2019-09-24 ENCOUNTER — Emergency Department
Admission: EM | Admit: 2019-09-24 | Discharge: 2019-09-24 | Disposition: A | Discharge status: Home / Self Care | Payer: PRIVATE HEALTH INSURANCE | Attending: Emergency Medicine | Admitting: Emergency Medicine

## 2019-09-24 ENCOUNTER — Encounter: Payer: Self-pay | Admitting: Emergency Medicine

## 2019-09-24 ENCOUNTER — Emergency Department: Payer: PRIVATE HEALTH INSURANCE

## 2019-09-24 ENCOUNTER — Other Ambulatory Visit: Payer: Self-pay

## 2019-09-24 DIAGNOSIS — M79605 Pain in left leg: Secondary | ICD-10-CM | POA: Insufficient documentation

## 2019-09-24 MED ORDER — NAPROXEN 500 MG PO TABS: 500.0000 mg | ORAL_TABLET | Freq: Two times a day (BID) | ORAL | 0 refills | Status: AC
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
## 2019-09-24 NOTE — Discharge Instructions (Addendum)
Follow discharge care instruction using heat instead of ice.  Take medication as directed. °

## 2021-07-12 ENCOUNTER — Emergency Department (HOSPITAL_BASED_OUTPATIENT_CLINIC_OR_DEPARTMENT_OTHER): Payer: Non-veteran care

## 2021-07-12 ENCOUNTER — Emergency Department (HOSPITAL_BASED_OUTPATIENT_CLINIC_OR_DEPARTMENT_OTHER)
Admission: EM | Admit: 2021-07-12 | Discharge: 2021-07-12 | Disposition: A | Discharge status: Home / Self Care | Payer: No Typology Code available for payment source | Attending: Emergency Medicine | Admitting: Emergency Medicine

## 2021-07-12 ENCOUNTER — Other Ambulatory Visit: Payer: Self-pay

## 2021-07-12 ENCOUNTER — Encounter (HOSPITAL_BASED_OUTPATIENT_CLINIC_OR_DEPARTMENT_OTHER): Payer: Self-pay | Admitting: Emergency Medicine

## 2021-07-12 DIAGNOSIS — W108XXA Fall (on) (from) other stairs and steps, initial encounter: Secondary | ICD-10-CM | POA: Insufficient documentation

## 2021-07-12 DIAGNOSIS — S0990XA Unspecified injury of head, initial encounter: Secondary | ICD-10-CM | POA: Diagnosis not present

## 2021-07-12 DIAGNOSIS — Y9289 Other specified places as the place of occurrence of the external cause: Secondary | ICD-10-CM | POA: Diagnosis not present

## 2021-07-12 DIAGNOSIS — S01112A Laceration without foreign body of left eyelid and periocular area, initial encounter: Secondary | ICD-10-CM | POA: Diagnosis not present

## 2021-07-12 DIAGNOSIS — Y990 Civilian activity done for income or pay: Secondary | ICD-10-CM | POA: Insufficient documentation

## 2021-07-12 DIAGNOSIS — W19XXXA Unspecified fall, initial encounter: Secondary | ICD-10-CM

## 2021-07-12 DIAGNOSIS — S0993XA Unspecified injury of face, initial encounter: Secondary | ICD-10-CM | POA: Diagnosis present

## 2021-07-12 MED ORDER — IBUPROFEN 400 MG PO TABS
600.0000 mg | ORAL_TABLET | Freq: Once | ORAL | Status: AC
  Administered 2021-07-12: 600 mg via ORAL
  Filled 2021-07-12: qty 1
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
## 2021-07-12 NOTE — Discharge Instructions (Addendum)
Your CT scan of the head was normal and without concern.  Take Tylenol as you need to.  You can use ice over the left side of the face as you need to.  If you have any concerning symptoms you can return to the emergency room otherwise follow-up with your primary care provider. You do not have any symptoms of concussion today.  However I have attached information sheet for you I goes over what to expect and what is concerning that warrant return to the emergency room.
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
## 2023-04-16 ENCOUNTER — Emergency Department (HOSPITAL_BASED_OUTPATIENT_CLINIC_OR_DEPARTMENT_OTHER)
Admission: EM | Admit: 2023-04-16 | Discharge: 2023-04-16 | Disposition: A | Discharge status: Home / Self Care | Payer: Worker's Compensation | Attending: Emergency Medicine | Admitting: Emergency Medicine

## 2023-04-16 ENCOUNTER — Encounter (HOSPITAL_BASED_OUTPATIENT_CLINIC_OR_DEPARTMENT_OTHER): Payer: Self-pay

## 2023-04-16 ENCOUNTER — Emergency Department (HOSPITAL_BASED_OUTPATIENT_CLINIC_OR_DEPARTMENT_OTHER): Admitting: Radiology

## 2023-04-16 ENCOUNTER — Other Ambulatory Visit: Payer: Self-pay

## 2023-04-16 ENCOUNTER — Emergency Department (HOSPITAL_BASED_OUTPATIENT_CLINIC_OR_DEPARTMENT_OTHER): Payer: Worker's Compensation | Admitting: Radiology

## 2023-04-16 DIAGNOSIS — R0781 Pleurodynia: Secondary | ICD-10-CM | POA: Diagnosis not present

## 2023-04-16 DIAGNOSIS — S20412A Abrasion of left back wall of thorax, initial encounter: Secondary | ICD-10-CM | POA: Insufficient documentation

## 2023-04-16 DIAGNOSIS — S29002A Unspecified injury of muscle and tendon of back wall of thorax, initial encounter: Secondary | ICD-10-CM | POA: Diagnosis present

## 2023-04-16 DIAGNOSIS — Y990 Civilian activity done for income or pay: Secondary | ICD-10-CM | POA: Diagnosis not present

## 2023-04-16 LAB — URINALYSIS, ROUTINE W REFLEX MICROSCOPIC
Bacteria, UA: NONE SEEN
Bilirubin Urine: NEGATIVE
Glucose, UA: NEGATIVE mg/dL
Hgb urine dipstick: NEGATIVE
Ketones, ur: NEGATIVE mg/dL
Leukocytes,Ua: NEGATIVE
Nitrite: NEGATIVE
Protein, ur: 30 mg/dL — AB
Specific Gravity, Urine: 1.016 (ref 1.005–1.030)
pH: 7 (ref 5.0–8.0)

## 2023-04-16 NOTE — Discharge Instructions (Addendum)
 You were seen in the emergency department today for concerns of a muscle and rib pain.  Your urine does not show any signs of blood and there reduce the risk of a kidney injury.  Your x-ray is also negative for any acute fractures.  I would continue to use Tylenol or ibuprofen for pain.  Return the emergency department for any concerns of new or worsening symptoms.

## 2023-04-16 NOTE — ED Notes (Signed)
Patient verbalizes understanding of discharge instructions. Opportunity for questioning and answers were provided. Armband removed by staff, pt discharged from ED. Ambulated out to lobby  
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
## 2023-04-16 NOTE — ED Notes (Signed)
 Patient transported to X-ray
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
